# Patient Record
Sex: Male | Born: 1975 | Race: White | Hispanic: No | Marital: Married | Smoking: Never smoker
Health system: Southern US, Community
[De-identification: ages and names within clinical notes are randomized; demographics above are authoritative.]

## PROBLEM LIST (undated history)

## (undated) DIAGNOSIS — E785 Hyperlipidemia, unspecified: Secondary | ICD-10-CM

## (undated) DIAGNOSIS — I10 Essential (primary) hypertension: Secondary | ICD-10-CM

---

## 2011-06-14 ENCOUNTER — Other Ambulatory Visit: Payer: Self-pay

## 2011-06-14 ENCOUNTER — Emergency Department (HOSPITAL_COMMUNITY)
Admission: EM | Admit: 2011-06-14 | Discharge: 2011-06-15 | Disposition: A | Discharge status: Home / Self Care | Payer: BC Managed Care – PPO | Attending: Emergency Medicine | Admitting: Emergency Medicine

## 2011-06-14 DIAGNOSIS — R071 Chest pain on breathing: Secondary | ICD-10-CM | POA: Insufficient documentation

## 2011-06-14 DIAGNOSIS — E7800 Pure hypercholesterolemia, unspecified: Secondary | ICD-10-CM | POA: Insufficient documentation

## 2011-06-14 DIAGNOSIS — R0781 Pleurodynia: Secondary | ICD-10-CM

## 2011-06-14 DIAGNOSIS — Z79899 Other long term (current) drug therapy: Secondary | ICD-10-CM | POA: Insufficient documentation

## 2011-06-14 HISTORY — DX: Hyperlipidemia, unspecified: E78.5

## 2011-06-14 HISTORY — DX: Essential (primary) hypertension: I10

## 2011-06-14 MED ORDER — KETOROLAC TROMETHAMINE 30 MG/ML IJ SOLN
30.0000 mg | Freq: Once | INTRAMUSCULAR | Status: AC
  Administered 2011-06-15: 30 mg via INTRAVENOUS
  Filled 2011-06-14: qty 1

## 2011-06-14 NOTE — ED Notes (Addendum)
Per EMS, pt has been having CP x 3days. However, today at 2130, CP became worse while at work. Pain is in Left upper Chest and feels like pressure. There is no radiation. No Nausea vomiting. However, pt did state that he becomes dizzy and SOB with CP.  Pt was given 5 Nitroglycerin. 1 adult ASA.  12 lead EKG showed NSR. Vitals: 118/74, 90s-102. Lung sounds clear. 20g R hand. No N/V, Currently, No respiratory distress. Pain is 5 out of 10. Will continue to monitor.

## 2011-06-14 NOTE — ED Provider Notes (Signed)
History     CSN: [id number]  Arrival date & time 06/14/11  2229   First MD Initiated Contact with Patient 06/14/11 2341      Chief Complaint  Patient presents with  . Chest Pain    (Consider location/radiation/quality/duration/timing/severity/associated sxs/prior treatment) HPI Comments: 36 year old male with a history of panic attacks and hypercholesterolemia who presents with 2 days of intermittent left-sided sharp chest pain. This started when he awoke on Friday morning, has occurred throughout the day but seems to be worse at night, worse with taking deep breaths, worse with movement of the left arm. He has never had this pain before. He denies travel, trauma, immobilization, hormone therapy, injuries to the legs, swelling, pain in the legs. He also denies exertional symptoms in fact he states that the pain improves when he exerts himself. He does have a family member, his father who has had DVT as well as obstructive coronary artery disease in the past. 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 Currently it is mild, worse with deep breathing. He denies coughing, fever, shortness of breath or recent upper respiratory or gastrointestinal illnesses  Patient is a 36 y.o. male presenting with chest pain. The history is provided by the patient and a relative.  Chest Pain     No past medical history on file.  No past surgical history on file.  No family history on file.  History  Substance Use Topics  . Smoking status: Not on file  . Smokeless tobacco: Not on file  . Alcohol Use: Not on file      Review of Systems  Cardiovascular: Positive for chest pain.  All other systems reviewed and are negative.    Allergies  Phenergan  Home Medications   Current Outpatient Rx  Name Route Sig Dispense Refill  . XANAX PO Oral Take 1 tablet by mouth daily as needed. For  anxiety    . LIPITOR PO Oral Take 1 tablet by mouth every morning.    . CELEXA PO Oral Take 1 tablet by mouth every morning.    Marland Kitchen HYDROCODONE-ACETAMINOPHEN 5-500 MG PO TABS Oral Take 1 tablet by mouth every 8 (eight) hours as needed. For pain    . NAPROXEN 500 MG PO TABS Oral Take 1 tablet (500 mg total) by mouth 2 (two) times daily with a meal. 30 tablet 0    BP 139/83  Pulse 91  Temp(Src) 98.4 F (36.9 C) (Oral)  Resp 14  SpO2 100%  Physical Exam  Nursing note and vitals reviewed. Constitutional: He appears well-developed and well-nourished. No distress.  HENT:  Head: Normocephalic and atraumatic.  Mouth/Throat: Oropharynx is clear and moist. No oropharyngeal exudate.  Eyes: Conjunctivae and EOM are normal. Pupils are equal, round, and reactive to light. Right eye exhibits no discharge. Left eye exhibits no discharge. No scleral icterus.  Neck: Normal range of motion. Neck supple. No JVD present. No thyromegaly present.  Cardiovascular: Normal rate, regular rhythm, normal heart sounds and intact distal pulses.  Exam reveals no gallop and no friction rub.   No murmur heard. Pulmonary/Chest: Effort normal and breath sounds normal. No respiratory distress. He has no wheezes. He has no rales. He exhibits tenderness ( Focal left-sided chest tenderness with palpation of the left chest wall as well as with range of motion of the left arm to extension of the pectoralis muscle. He states this mimics the  pain in his chest.).  Abdominal: Soft. Bowel sounds are normal. He exhibits no distension and no mass. There is no tenderness.  Musculoskeletal: Normal range of motion. He exhibits no edema and no tenderness.  Lymphadenopathy:    He has no cervical adenopathy.  Neurological: He is alert. Coordination normal.  Skin: Skin is warm and dry. No rash noted. No erythema.  Psychiatric: He has a normal mood and affect. His behavior is normal.    ED Course  Procedures (including critical care  time)  ED ECG REPORT   Date: 06/15/2011   Rate: 82  Rhythm: normal sinus rhythm  QRS Axis: normal  Intervals: normal  ST/T Wave abnormalities: normal  Conduction Disutrbances:none  Narrative Interpretation:   Old EKG Reviewed: None available   Labs Reviewed  CBC - Abnormal; Notable for the following:    MCHC 36.4 (*)    All other components within normal limits  POCT I-STAT, CHEM 8 - Abnormal; Notable for the following:    Glucose, Bld 105 (*)    All other components within normal limits  D-DIMER, QUANTITATIVE  DIFFERENTIAL  POCT I-STAT TROPONIN I   Dg Chest 2 View  06/15/2011  *RADIOLOGY REPORT*  Clinical Data: Shortness of breath  CHEST - 2 VIEW  Comparison: None.  Findings: Cardiomediastinal contours within normal limits.  Minimal bronchitic change.  No focal consolidation.  No pleural effusion or pneumothorax.  No acute osseous finding.  IMPRESSION: Minimal bronchitic change without focal consolidation.  Original Report Authenticated By: Waneta Martins, M.D.     1. Pleuritic chest pain       MDM  Overall the patient is well-appearing with vital signs  that are normal including blood pressure, pulse, oxygen level. His EKG shows normal sinus rhythm without any signs of right heart strain or ischemia. He does have a family history of DVT as well as pleuritic chest pain which I cannot explain other than a possible chest wall syndrome. We'll order d-dimer, chest x-ray, anticipate discharge is no pulmonary embolism.  D-dimer reviewed, laboratory workup and x-ray reviewed, no significant abnormalities, d-dimer normal, vital signs normal, patient stable with no signs of hemodynamic compromise, tachycardia or hypoxia. There is no signs of pulmonary infiltrate or pneumothorax and the patient appears stable for discharge. I considered to be very low risk for pulmonary embolism or coronary artery disease.  Discharge Prescriptions include:  Naprosyn    Vida Roller,  MD 06/15/11 [phone]

## 2011-06-15 ENCOUNTER — Encounter (HOSPITAL_COMMUNITY): Payer: Self-pay | Admitting: Emergency Medicine

## 2011-06-15 ENCOUNTER — Emergency Department (HOSPITAL_COMMUNITY): Payer: BC Managed Care – PPO

## 2011-06-15 LAB — POCT I-STAT, CHEM 8
BUN: 10 mg/dL (ref 6–23)
Calcium, Ion: 1.21 mmol/L (ref 1.12–1.32)
Chloride: 106 mEq/L (ref 96–112)
Glucose, Bld: 105 mg/dL — ABNORMAL HIGH (ref 70–99)

## 2011-06-15 LAB — DIFFERENTIAL
Basophils Absolute: 0 10*3/uL (ref 0.0–0.1)
Lymphocytes Relative: 19 % (ref 12–46)
Monocytes Absolute: 0.5 10*3/uL (ref 0.1–1.0)
Monocytes Relative: 6 % (ref 3–12)
Neutro Abs: 6.1 10*3/uL (ref 1.7–7.7)

## 2011-06-15 LAB — D-DIMER, QUANTITATIVE: D-Dimer, Quant: 0.37 ug/mL-FEU (ref 0.00–0.48)

## 2011-06-15 LAB — CBC
HCT: 39 % (ref 39.0–52.0)
Hemoglobin: 14.2 g/dL (ref 13.0–17.0)
RBC: 4.57 MIL/uL (ref 4.22–5.81)
WBC: 8.2 10*3/uL (ref 4.0–10.5)

## 2011-06-15 LAB — POCT I-STAT TROPONIN I: Troponin i, poc: 0 ng/mL (ref 0.00–0.08)

## 2011-06-15 MED ORDER — NAPROXEN 500 MG PO TABS: 500.0000 mg | ORAL_TABLET | Freq: Two times a day (BID) | ORAL | Status: AC

## 2011-06-15 NOTE — ED Notes (Signed)
I gave the patients visitors two cups of decaf coffee.

## 2011-06-15 NOTE — Discharge Instructions (Signed)
Your x-ray and blood tests are normal, please see the attached reading instructions and use Naprosyn twice a day. This should resolve within 7-10 days. Return to the hospital for severe or worsening pain or difficulty breathing.

## 2012-10-27 IMAGING — CR DG CHEST 2V
2 series · 2 of 2 positions shown · non-contrast
Comparison: None.

CLINICAL DATA: Shortness of breath

CHEST - 2 VIEW

[w chest pa]
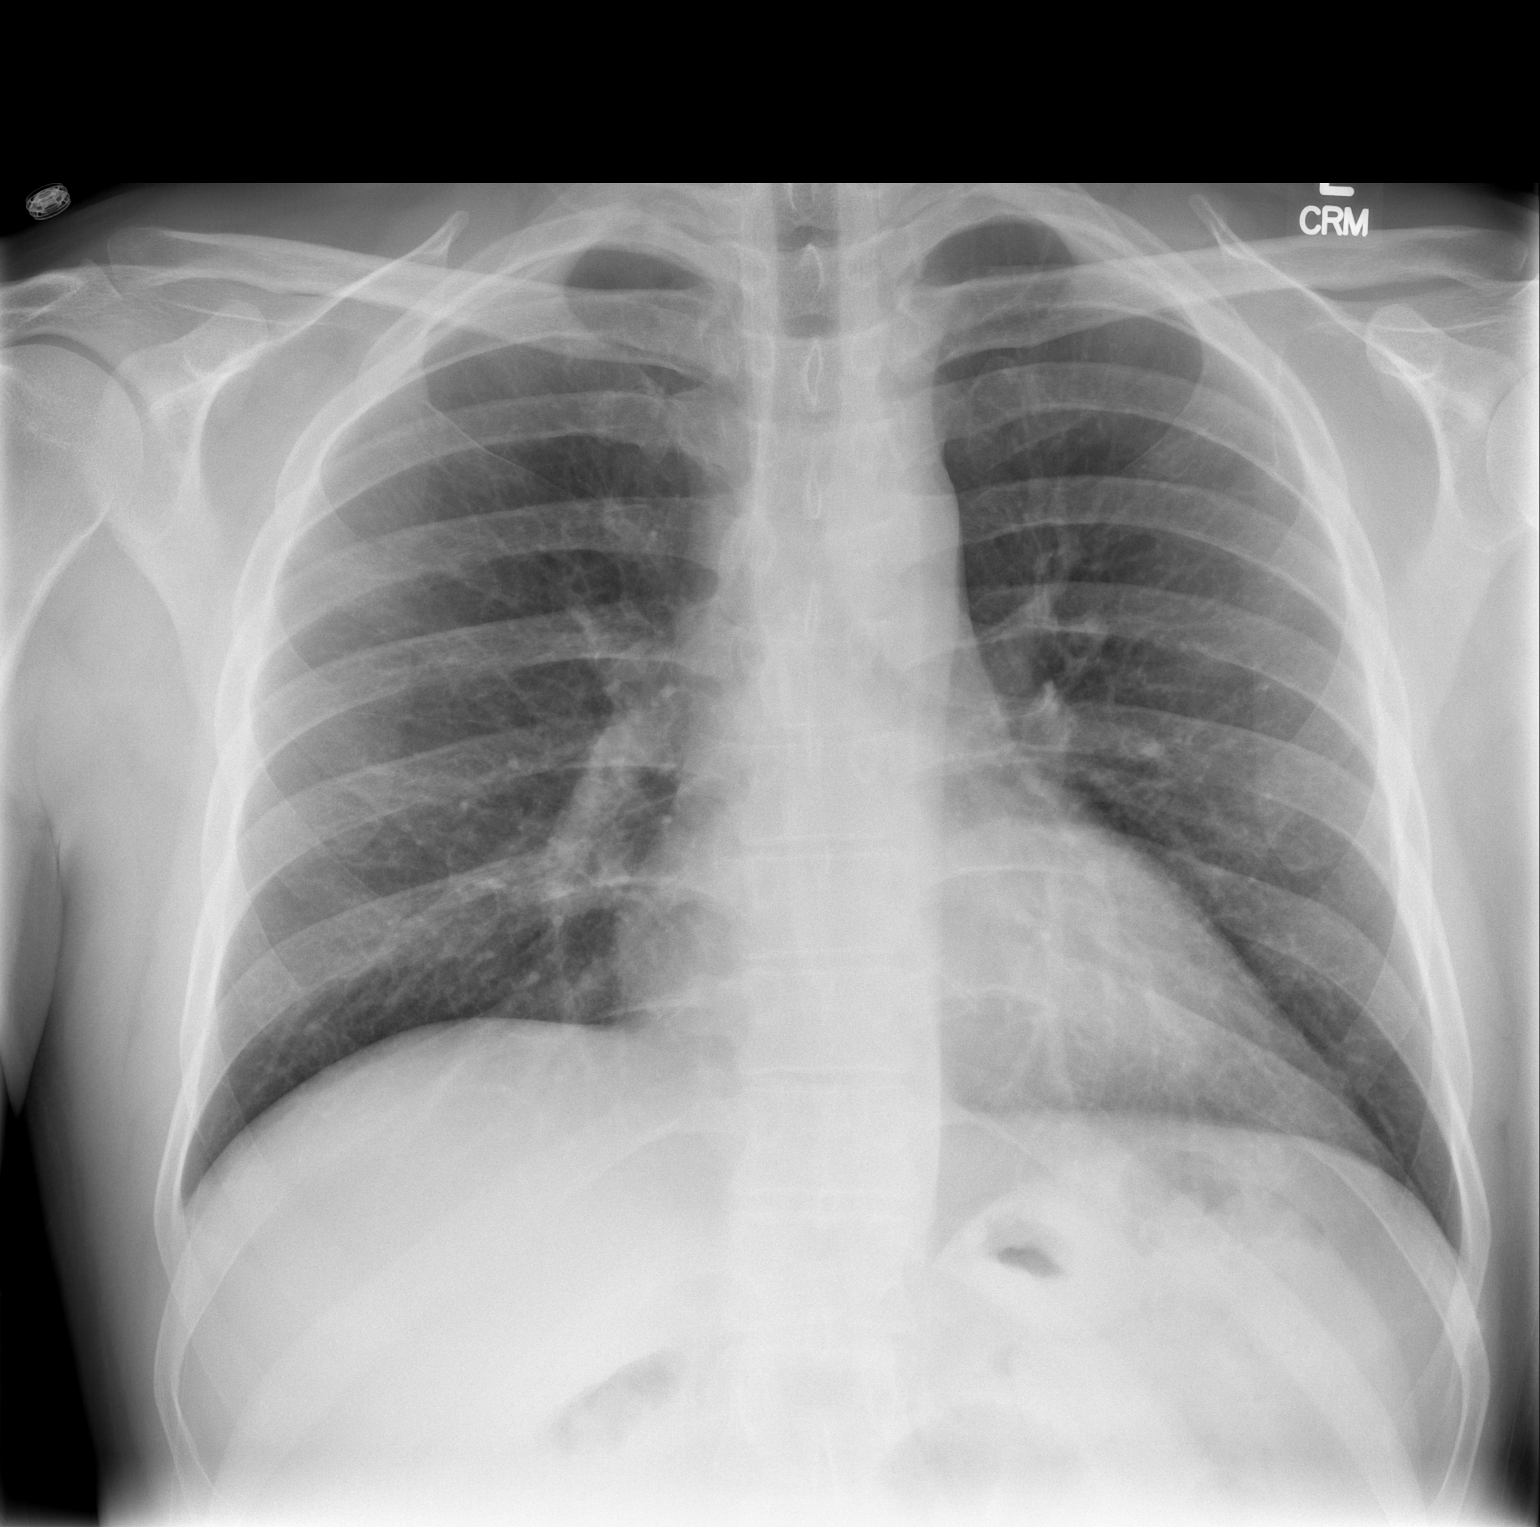

[w chest lat]
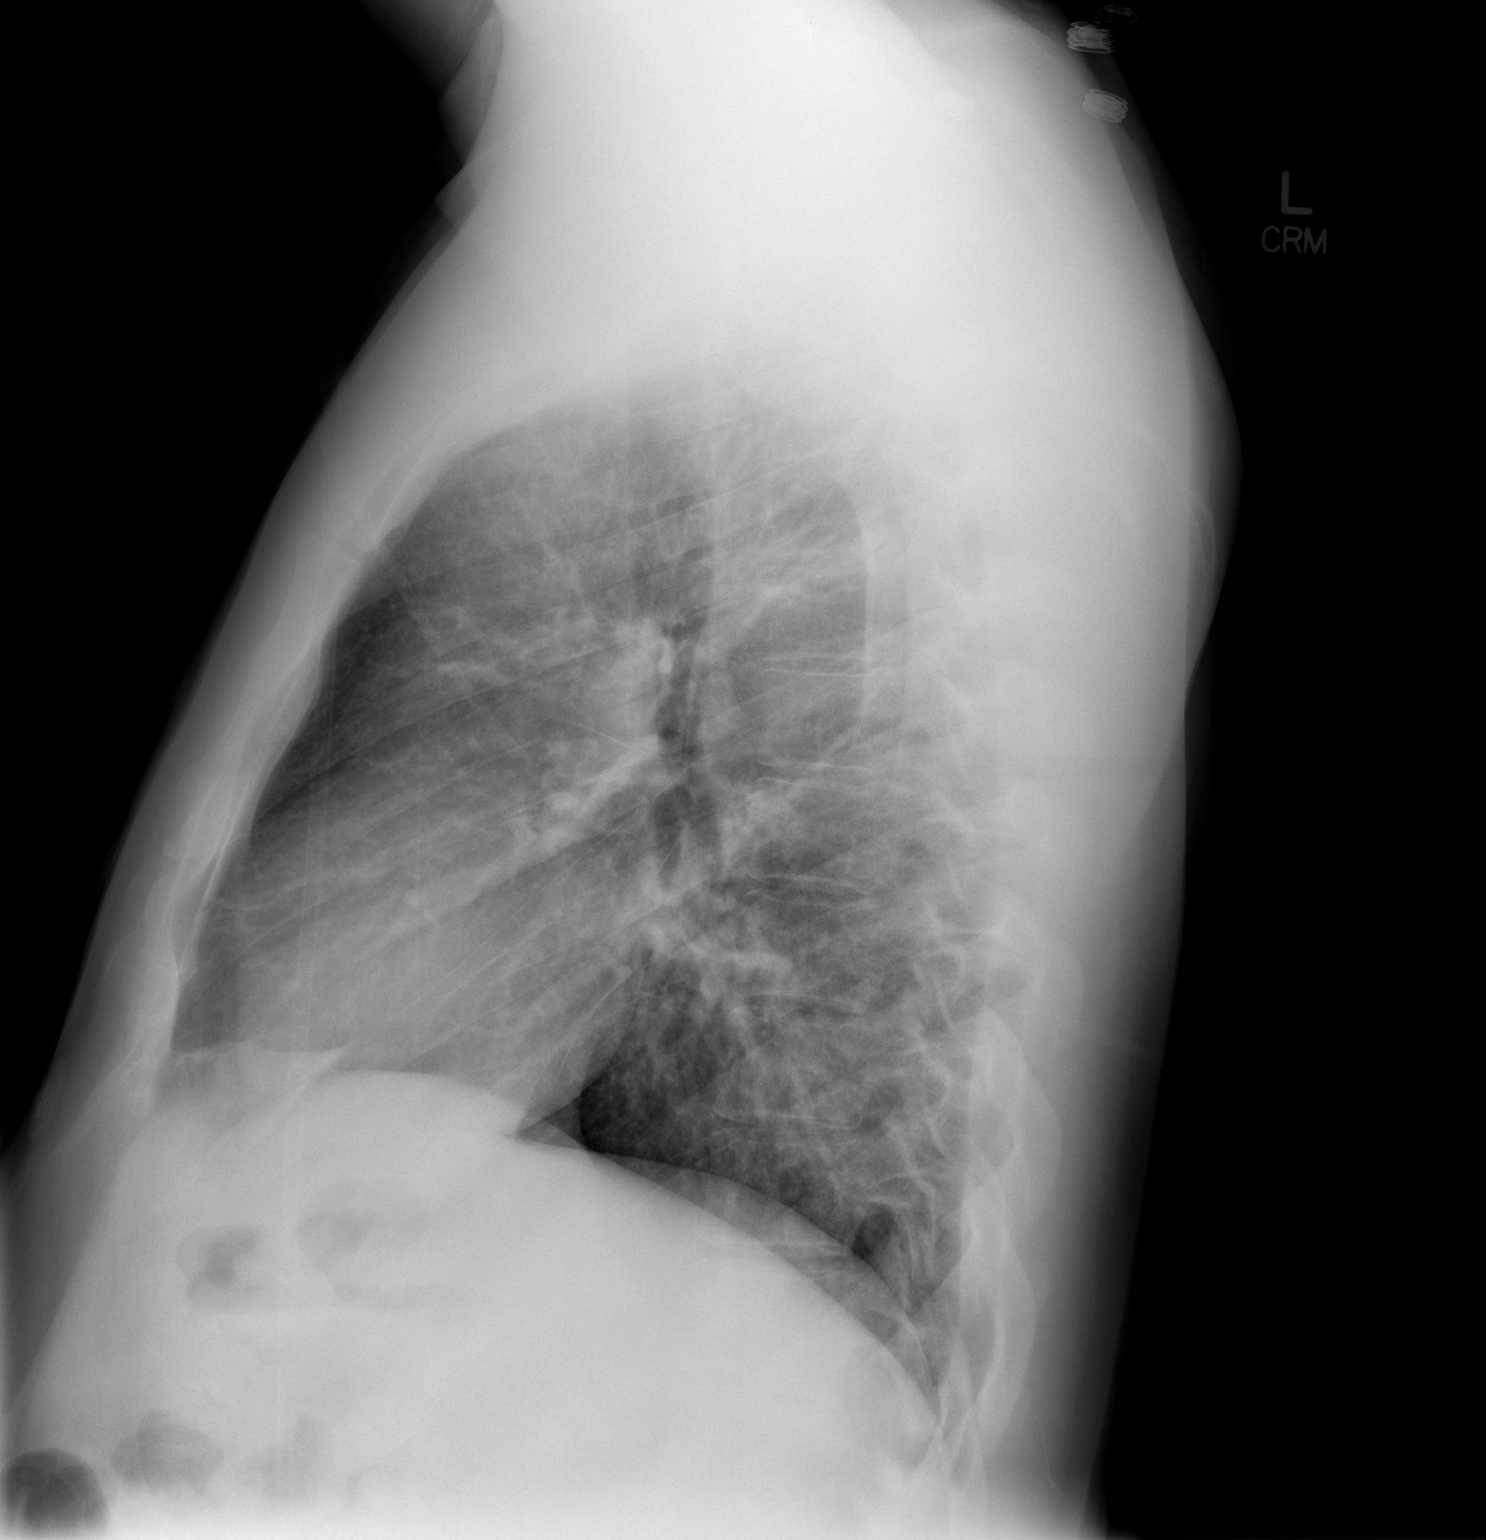

[2 of 2 positions shown; findings below may reference images not displayed]

FINDINGS: Cardiomediastinal contours within normal limits.  Minimal
bronchitic change.  No focal consolidation.  No pleural effusion or
pneumothorax.  No acute osseous finding.
IMPRESSION: Minimal bronchitic change without focal consolidation.
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
## 2017-10-01 ENCOUNTER — Encounter (HOSPITAL_COMMUNITY): Payer: Self-pay | Admitting: Psychiatry

## 2017-10-01 ENCOUNTER — Ambulatory Visit (HOSPITAL_COMMUNITY): Payer: Self-pay | Admitting: Psychiatry

## 2017-10-01 ENCOUNTER — Encounter

## 2017-10-01 ENCOUNTER — Encounter (INDEPENDENT_AMBULATORY_CARE_PROVIDER_SITE_OTHER): Payer: Self-pay

## 2017-10-01 VITALS — BP 158/82 | HR 76 | Ht 72.0 in | Wt 213.0 lb

## 2017-10-01 DIAGNOSIS — F331 Major depressive disorder, recurrent, moderate: Secondary | ICD-10-CM

## 2017-10-01 DIAGNOSIS — F4310 Post-traumatic stress disorder, unspecified: Secondary | ICD-10-CM | POA: Insufficient documentation

## 2017-10-01 MED ORDER — QUETIAPINE FUMARATE 25 MG PO TABS: 25.0000 mg | ORAL_TABLET | Freq: Every day | ORAL | 0 refills | Status: DC

## 2017-10-01 NOTE — Patient Instructions (Addendum)
1. Continue duloxetine 60 mg daily 2. Start quetiapine 25 mg at night  3. Return to clinic in one month for 30 mins 4. CONTACT INFORMATION  What to do if you need to get in touch with someone regarding a psychiatric issue:  1. EMERGENCY: For psychiatric emergencies (if you are suicidal or if there are any other safety issues) call 911 and/or go to your nearest Emergency Room immediately.   2. IF YOU NEED SOMEONE TO TALK TO RIGHT NOW: Given my clinical responsibilities, I may not be able to speak with you over the phone for a prolonged period of time.  a. You may always call The National Suicide Prevention Lifeline at 1-800-273-TALK 210-556-7453).  b. Your county of residence will also have local crisis services. For [facility]: [facility] at [phone] (24 Hour Crisis Hotline)

## 2017-10-04 ENCOUNTER — Telehealth (HOSPITAL_COMMUNITY): Payer: Self-pay | Admitting: Psychiatry

## 2017-10-04 NOTE — Telephone Encounter (Signed)
10/04/17 3:15pm Patient's wife called requesting the release of information form to be mail/sh
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
## 2017-11-02 ENCOUNTER — Ambulatory Visit (INDEPENDENT_AMBULATORY_CARE_PROVIDER_SITE_OTHER): Payer: Self-pay | Admitting: Psychiatry

## 2017-11-02 VITALS — BP 124/83 | HR 92 | Ht 72.0 in | Wt 212.0 lb

## 2017-11-02 DIAGNOSIS — F4310 Post-traumatic stress disorder, unspecified: Secondary | ICD-10-CM

## 2017-11-02 DIAGNOSIS — R45851 Suicidal ideations: Secondary | ICD-10-CM

## 2017-11-02 DIAGNOSIS — F419 Anxiety disorder, unspecified: Secondary | ICD-10-CM

## 2017-11-02 DIAGNOSIS — G4700 Insomnia, unspecified: Secondary | ICD-10-CM

## 2017-11-02 DIAGNOSIS — F331 Major depressive disorder, recurrent, moderate: Secondary | ICD-10-CM

## 2017-11-02 MED ORDER — DULOXETINE HCL 60 MG PO CPEP: 60.0000 mg | ORAL_CAPSULE | Freq: Every day | ORAL | 0 refills | Status: AC

## 2017-11-02 MED ORDER — QUETIAPINE FUMARATE 25 MG PO TABS: 25.0000 mg | ORAL_TABLET | Freq: Every day | ORAL | 0 refills | Status: AC

## 2017-11-02 NOTE — Patient Instructions (Signed)
1. Continue duloxetine 60 mg daily  2. Continue quetiapine 25 mg at night  3. Return to clinic in one month for 30 mins
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
## 2017-12-02 ENCOUNTER — Ambulatory Visit (HOSPITAL_COMMUNITY): Payer: Self-pay | Admitting: Psychiatry
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
## 2017-12-22 ENCOUNTER — Ambulatory Visit (HOSPITAL_COMMUNITY): Payer: Self-pay | Admitting: Psychiatry
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
## 2017-12-28 ENCOUNTER — Ambulatory Visit (HOSPITAL_COMMUNITY): Payer: Self-pay | Admitting: Psychiatry

## 2017-12-30 ENCOUNTER — Telehealth (HOSPITAL_COMMUNITY): Payer: Self-pay | Admitting: *Deleted

## 2017-12-30 ENCOUNTER — Telehealth (HOSPITAL_COMMUNITY): Payer: Self-pay | Admitting: Psychiatry

## 2017-12-30 NOTE — Telephone Encounter (Signed)
Left message to contact the clinic

## 2017-12-30 NOTE — Telephone Encounter (Signed)
Discussed with the patient wife. The patient refused to talk with anybody (including this note Clinical research associatewriter) when his wife asked him to contact with us. He has been this way for the last few days. He commented that he cannot continue doing like this, referring to issues with employment and disability.He also mentioned that he did have SI (details unknown) in the past.  His wife locked guns, and he does not have access to it. Informed petition process/emergency contact if there is any safety concern from the patient or his wife. His wife denies that he is at that imminent situation, although she does have concern about him. She agrees to talk with him to see if he is open to have follow up appointment at this clinic (he no showed a few days ago). Advised to contact the clinic if any further concerns.

## 2017-12-30 NOTE — Telephone Encounter (Signed)
Dr Vanetta ShawlHisada [facility] [employer] She had called to follow up with his claim  & the wife mad the statement  He is suicidal. So the claim adjuster became concerned & called us.  I called the home  & spoke with the wife  & told her I had received a call & is Mr Baldo AshCarl okay? She stated he's frustrated with the whole claim process & made the statement it may be better if I weren't here. I asked her how is he, is he suicidal , does he have a plan to harm himself. And I asked her if she knew to call the Suicidal hotline if anything changes  & to call 911 or get him to a hospital? She stated she is aware.

## 2018-01-03 ENCOUNTER — Telehealth (HOSPITAL_COMMUNITY): Payer: Self-pay | Admitting: Psychiatry

## 2018-01-03 NOTE — Telephone Encounter (Signed)
He has not made the follow up appointment as of today. Left voice message to contact the office (given his wife's concern of his depression/SI).
